# Patient Record
Sex: Male | Born: 1985 | Hispanic: Yes | Marital: Single | Smoking: Former smoker
Health system: Southern US, Community
[De-identification: ages and names within clinical notes are randomized; demographics above are authoritative.]

## PROBLEM LIST (undated history)

## (undated) HISTORY — PX: TOOTH EXTRACTION: SUR596

---

## 2007-03-21 ENCOUNTER — Emergency Department: Payer: Self-pay | Admitting: Unknown Physician Specialty

## 2009-04-11 IMAGING — CR DG FOOT COMPLETE 3+V*L*
1 series · 3 of 3 positions shown · non-contrast
Comparison: none

REASON FOR EXAM: injry
COMMENTS:

PROCEDURE:     DXR - DXR FOOT LT COMP W/OBLIQUES  - March 21, 2007 [DATE]
RESULT:     There is a minimally displaced fracture at the distal shaft of
the proximal phalanx of the middle toe. No other fractures are seen.

[Series 1: view not recorded · 0.17mm/px · 3 of 3 slices shown]
[im 1/3]
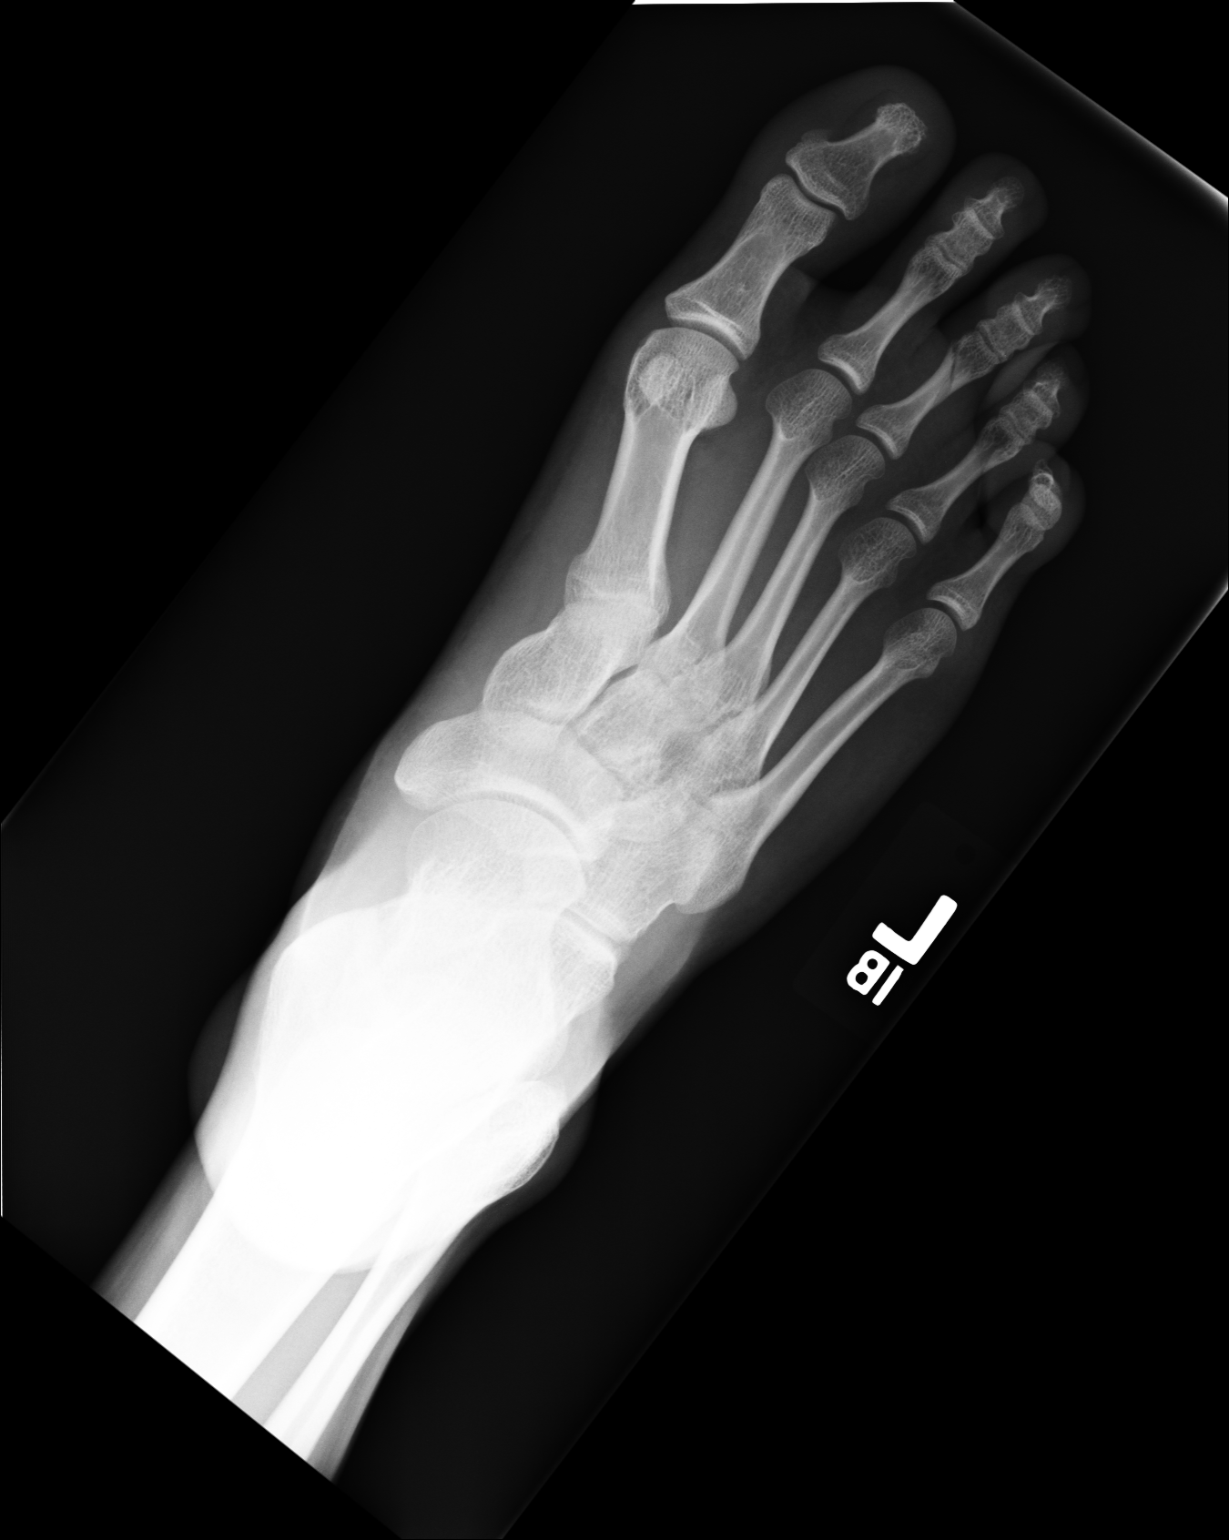
[im 2/3]
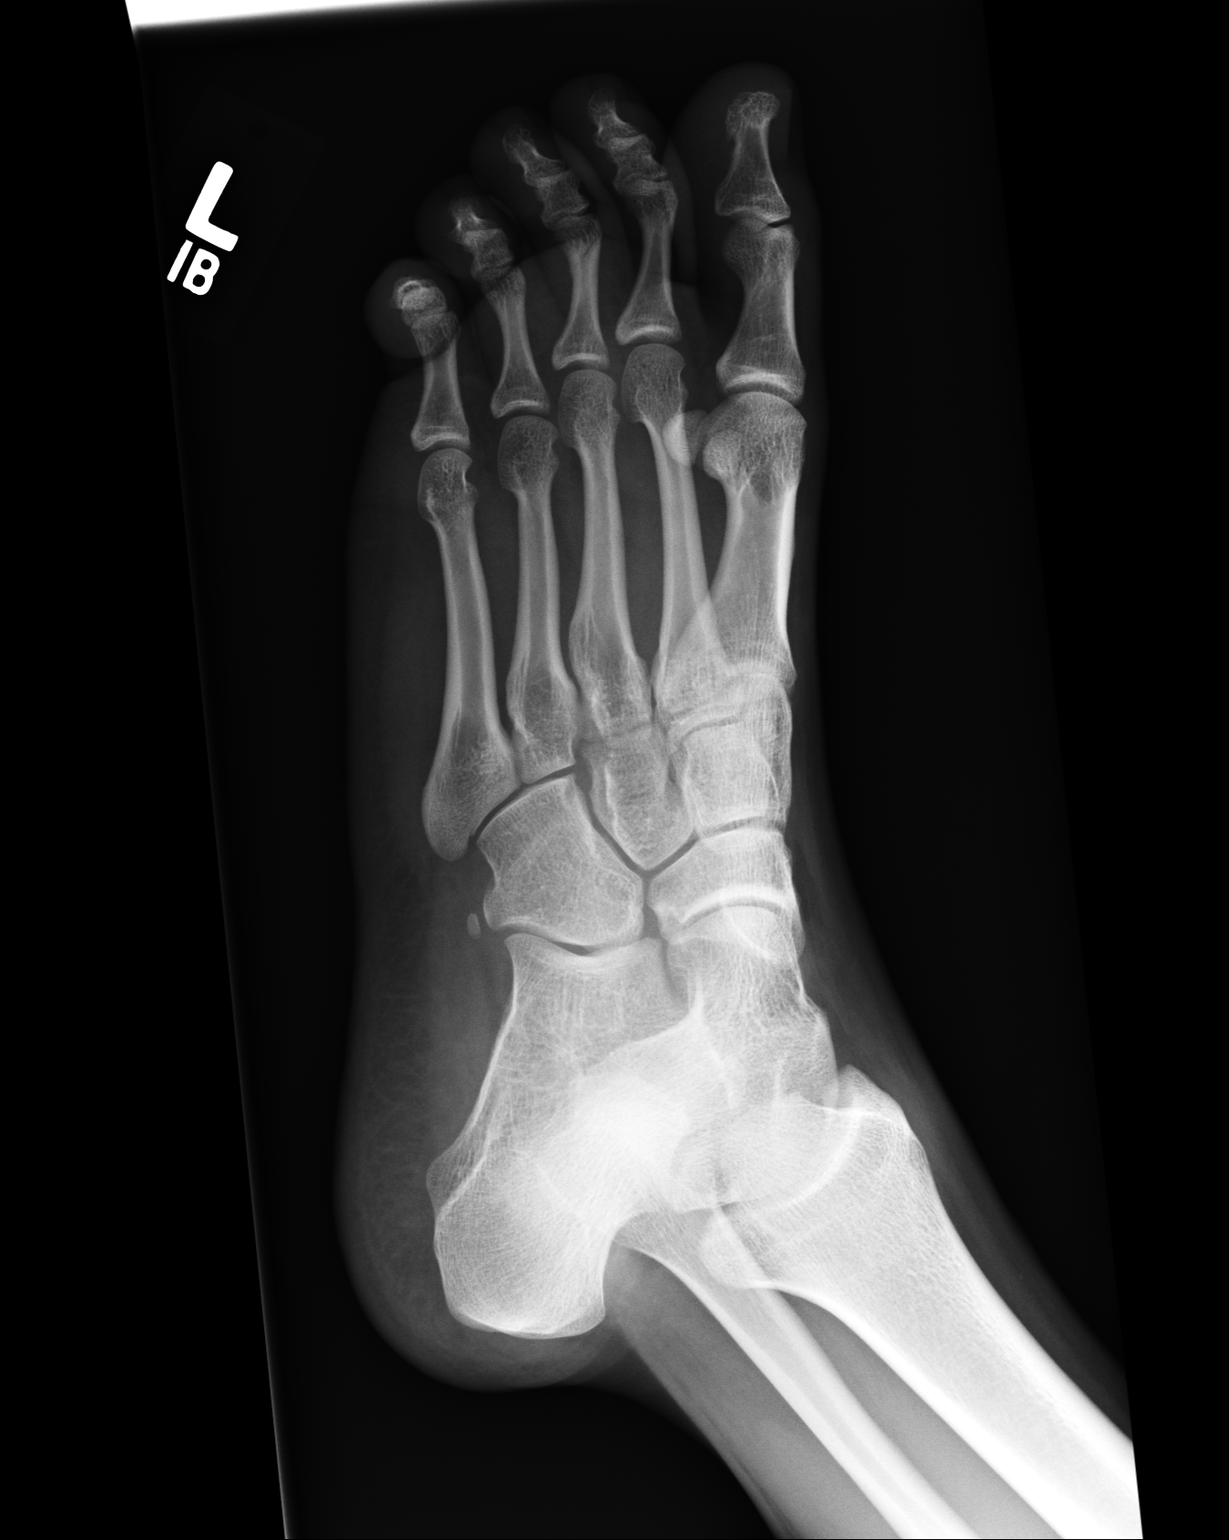
[im 3/3]
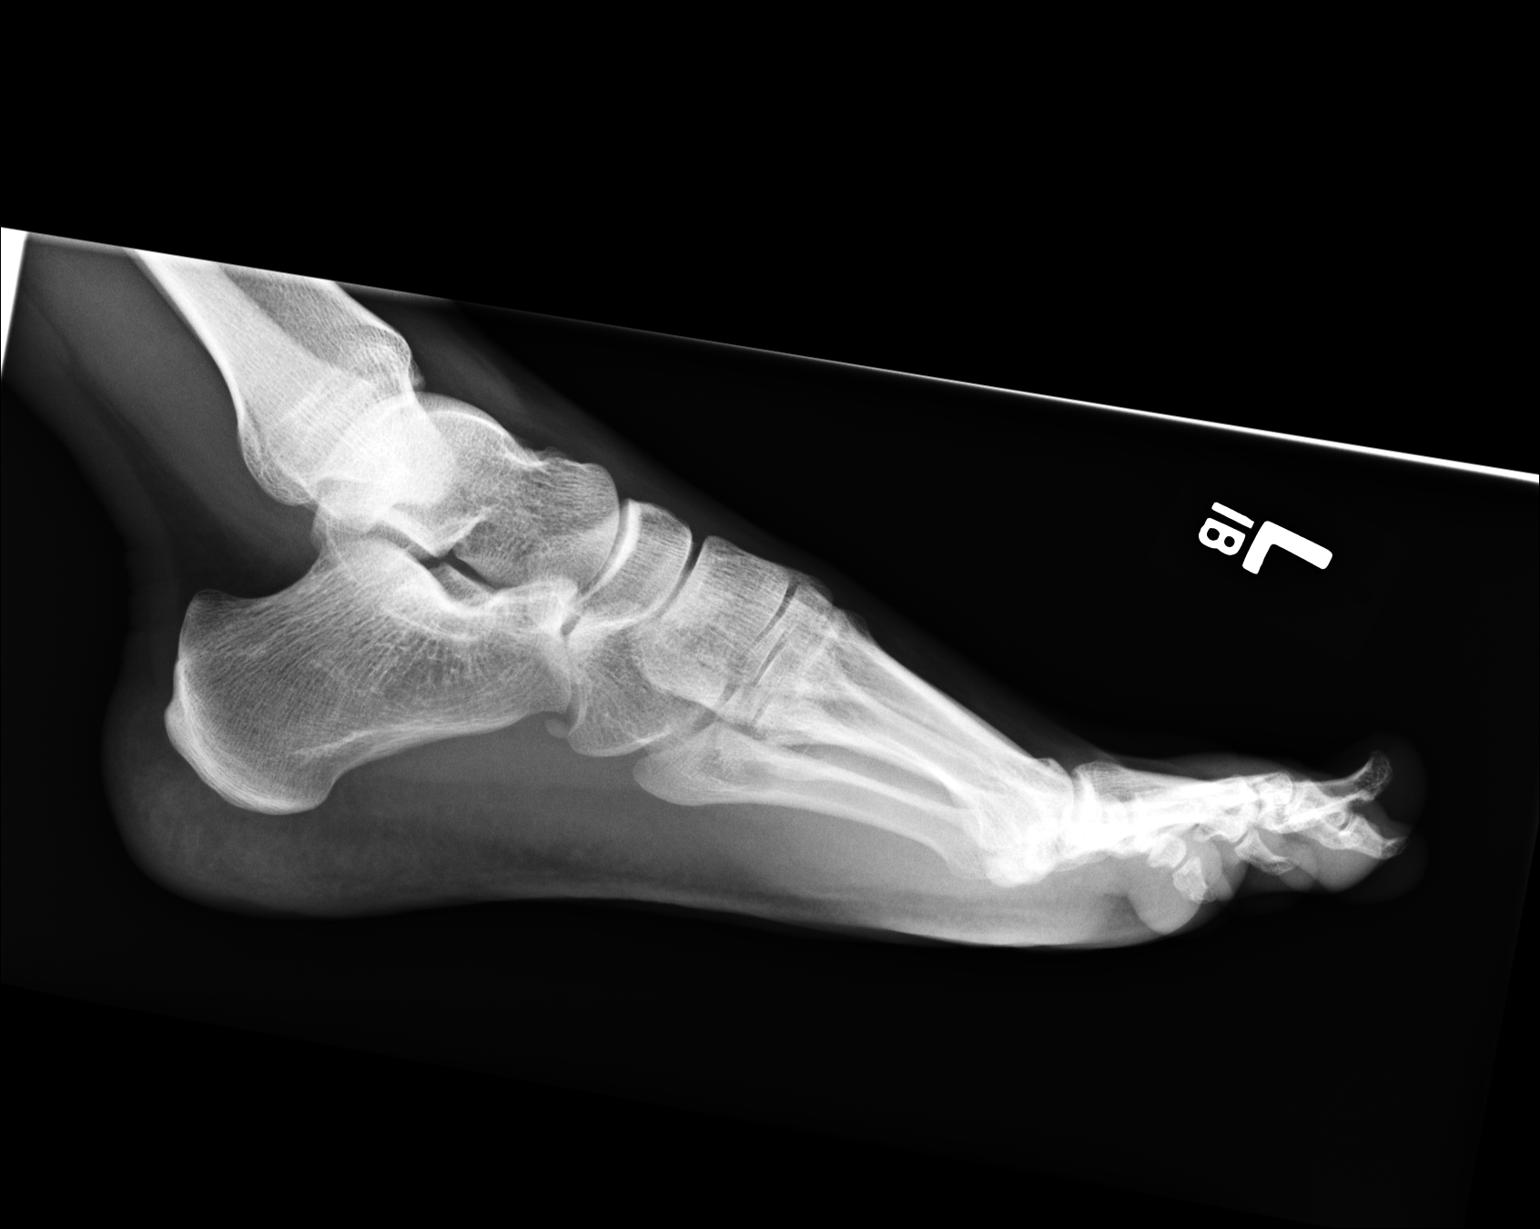

[3 of 3 positions shown; findings below may reference images not displayed]

IMPRESSION: 1.     Fracture of the proximal phalanx of the LEFT third toe, minimally
displaced.

## 2017-08-09 ENCOUNTER — Encounter: Payer: Self-pay | Admitting: Urology

## 2017-08-09 ENCOUNTER — Ambulatory Visit (INDEPENDENT_AMBULATORY_CARE_PROVIDER_SITE_OTHER): Payer: Medicaid Other | Admitting: Urology

## 2017-08-09 VITALS — BP 132/79 | HR 92 | Ht 67.0 in | Wt 146.0 lb

## 2017-08-09 DIAGNOSIS — Z3009 Encounter for other general counseling and advice on contraception: Secondary | ICD-10-CM

## 2017-08-09 NOTE — Progress Notes (Signed)
08/09/2017 5:00 PM   Xavier Hutchinson May 16, 1986 [id number]  Referring provider: No referring provider defined for this encounter.  Chief Complaint  Patient presents with  . VAS Consult    HPI: 32 year old male presents today for vasectomy consultation.  He is married with 4 children and desires vasectomy as a means of permanent sterilization.  He denies previous history of urologic problems, prior genitourinary surgery or chronic scrotal pain.   PMH: History reviewed. No pertinent past medical history.  Surgical History: Past Surgical History:  Procedure Laterality Date  . TOOTH EXTRACTION      Home Medications:  Allergies as of 08/09/2017   No Known Allergies     Medication List    as of 08/09/2017  5:00 PM   You have not been prescribed any medications.     Allergies: No Known Allergies  Family History: Family History  Problem Relation Age of Onset  . Prostate cancer Neg Hx   . Bladder Cancer Neg Hx   . Kidney cancer Neg Hx     Social History:  reports that he has quit smoking. he has never used smokeless tobacco. He reports that he does not drink alcohol or use drugs.  ROS: UROLOGY Frequent Urination?: No Hard to postpone urination?: No Burning/pain with urination?: No Get up at night to urinate?: No Leakage of urine?: No Urine stream starts and stops?: No Trouble starting stream?: No Do you have to strain to urinate?: No Blood in urine?: No Urinary tract infection?: No Sexually transmitted disease?: No Injury to kidneys or bladder?: No Painful intercourse?: No Weak stream?: No Erection problems?: No Penile pain?: No  Gastrointestinal Nausea?: No Vomiting?: No Indigestion/heartburn?: Yes Diarrhea?: No Constipation?: No  Constitutional Fever: No Night sweats?: No Weight loss?: No Fatigue?: No  Skin Skin rash/lesions?: No Itching?: No  Eyes Blurred vision?: Yes Double vision?: No  Ears/Nose/Throat Sore throat?: No Sinus  problems?: No  Hematologic/Lymphatic Swollen glands?: No Easy bruising?: No  Cardiovascular Leg swelling?: No Chest pain?: No  Respiratory Cough?: No Shortness of breath?: Yes  Endocrine Excessive thirst?: No  Musculoskeletal Back pain?: No Joint pain?: Yes  Neurological Headaches?: Yes Dizziness?: Yes  Psychologic Depression?: No Anxiety?: Yes  Physical Exam: BP 132/79 (BP Location: Right Arm, Patient Position: Sitting, Cuff Size: Normal)   Pulse 92   Ht 5\' 7"  (1.702 m)   Wt 146 lb (66.2 kg)   BMI 22.87 kg/m   Constitutional:  Alert and oriented, No acute distress. HEENT: [place] AT, moist mucus membranes.  Trachea midline, no masses. Cardiovascular: No clubbing, cyanosis, or edema. Respiratory: Normal respiratory effort, no increased work of breathing. GI: Abdomen is soft, nontender, nondistended, no abdominal masses GU: No CVA tenderness.  Penis without lesions, testes descended bilaterally without masses or tenderness, vasa easily palpable.  Cords/epididymes palpably normal Skin: No rashes, bruises or suspicious lesions. Lymph: No cervical or inguinal adenopathy. Neurologic: Grossly intact, no focal deficits, moving all 4 extremities. Psychiatric: Normal mood and affect.    Assessment & Plan:   We had a long discussion about vasectomy. We specifically discussed the procedure, recovery and the risks, benefits and alternatives of vasectomy. I explained that the procedure entails removal of a segment of each vas deferens, each of which conducts sperm, and that the purpose of this procedure is to cause sterility (inability to produce children or cause pregnancy). Vasectomy is intended to be permanent and irreversible form of contraception. Options for fertility after vasectomy include vasectomy reversal, or sperm retrieval with  in vitro fertilization. These options are not always successful, and they may be expensive. We discussed reversible forms of birth control such as  condoms, IUD or diaphragms, as well as the option of freezing sperm in a sperm bank prior to the vasectomy procedure. We discussed the importance of avoiding strenuous exercise for four days after vasectomy, and the importance of refraining from any form of ejaculation for seven days after vasectomy. I explained that vasectomy does not produce immediate sterility so another form of contraceptive must be used until sterility is assured by having semen checked for sperm. Thus, a post vasectomy semen analysis is necessary to confirm sterility. Rarely, vasectomy must be repeated. We discussed the approximately 1 in 2,000 risk of pregnancy after vasectomy for men who have post-vasectomy semen analysis showing absent sperm or rare non-motile sperm. Typical side effects include a small amount of oozing blood, some discomfort and mild swelling in the area of incision.  Vasectomy does not affect sexual performance, function, please, sensation, interest, desire, satisfaction, penile erection, volume of semen or ejaculation. Other rare risks include allergy or adverse reaction to an anesthetic, testicular atrophy, hematoma, infection/abscess, prolonged tenderness of the vas deferens, pain, swelling, painful nodule or scar (called sperm granuloma) or epididymtis. We discussed chronic testicular pain syndrome. This has been reported to occur in as many as 1-2% of men and may be permanent. This can be treated with medication, small procedures or (rarely) surgery.  He indicated he would call back if he desires an anxiolytic premed.    Riki Altes, MD  [facility] [address] [place], [place] [address] [phone]

## 2017-09-25 ENCOUNTER — Encounter: Payer: Medicaid Other | Admitting: Urology

## 2017-09-28 ENCOUNTER — Ambulatory Visit (INDEPENDENT_AMBULATORY_CARE_PROVIDER_SITE_OTHER): Payer: Medicaid Other | Admitting: Urology

## 2017-09-28 ENCOUNTER — Encounter: Payer: Self-pay | Admitting: Urology

## 2017-09-28 VITALS — BP 121/79 | HR 76

## 2017-09-28 DIAGNOSIS — Z302 Encounter for sterilization: Secondary | ICD-10-CM | POA: Diagnosis not present

## 2017-10-02 NOTE — Progress Notes (Signed)
Vasectomy Procedure Note  Indications: The patient is a 32 y.o. male who presents today for elective sterilization.  He has been consented for the procedure.  He is aware of the risks and benefits.  He had no additional questions.  He agrees to proceed.  He denies any other significant change since his last visit.  Pre-operative Diagnosis: Elective sterilization  Post-operative Diagnosis: Elective sterilization  Premedication: Valium 10 mg po  Surgeon: Lorin PicketScott C. Amellia Panik, M.D  Description: The patient was prepped and draped in the standard fashion.  The right vas deferens was identified and brought superiorly to the anterior scrotal skin.  The skin and vas was then anesthetized utilizing 5 ml 1% lidocaine.  A small stab incision was made and spread with the vas dissector.  The vas was grasped utilizing the vas clamp and elevated out of the incision.  The vas was dissected free from surrounding tissue and vessels and an ~1 cm segment was excised.  The vas lumens were cauterized utilizing electrocautery.  The distal segment was buried in the surrounding sheath with a 3-0 chromic suture.  No significant bleeding was observed.  The vas ends were then dropped back into the hemiscrotum.  The skin was closed with hemostatic pressure.  An identical procedure was performed on the contralateral side.  Clean dry gauze was applied to the incision sites.  The patient tolerated the procedure well.  Complications:None  Recommendations: 1.  No lifting greater than 10 pounds or strenuousactivity for 1 week. 2.  Scrotal support for 1 week. 3.  Shower only for 1 week; may shower in the morning 4.  May resume intercourse in one week if no significant discomfort.  Continue alternate contraception for 12 weeks.  5.  Call for significant pain, swelling, redness, drainage or fever greater than 100.5. 6.  Rx hydrocodone/APAP 5/325 1-2 every 6 hours as needed for pain. 7.  Follow-up semen analyses 6 and 12 weeks.
